# Patient Record
Sex: Male | Born: 1940 | Race: White | Hispanic: No | Marital: Married | Smoking: Never smoker
Health system: Southern US, Community
[De-identification: ages and names within clinical notes are randomized; demographics above are authoritative.]

## PROBLEM LIST (undated history)

## (undated) DIAGNOSIS — M1712 Unilateral primary osteoarthritis, left knee: Secondary | ICD-10-CM

## (undated) DIAGNOSIS — E7800 Pure hypercholesterolemia, unspecified: Secondary | ICD-10-CM

## (undated) DIAGNOSIS — N189 Chronic kidney disease, unspecified: Secondary | ICD-10-CM

## (undated) DIAGNOSIS — I10 Essential (primary) hypertension: Secondary | ICD-10-CM

## (undated) DIAGNOSIS — F32A Depression, unspecified: Secondary | ICD-10-CM

## (undated) DIAGNOSIS — E119 Type 2 diabetes mellitus without complications: Secondary | ICD-10-CM

## (undated) HISTORY — PX: EYE SURGERY: SHX253

## (undated) HISTORY — PX: HERNIA REPAIR: SHX51

## (undated) HISTORY — PX: KNEE ARTHROSCOPY: SUR90

---

## 2016-08-27 HISTORY — PX: ROTATOR CUFF REPAIR: SHX139

## 2021-02-22 ENCOUNTER — Other Ambulatory Visit: Payer: Self-pay | Admitting: Physician Assistant

## 2021-02-22 DIAGNOSIS — M2392 Unspecified internal derangement of left knee: Secondary | ICD-10-CM

## 2021-03-06 ENCOUNTER — Ambulatory Visit: Payer: Medicare Other

## 2021-05-29 ENCOUNTER — Other Ambulatory Visit: Payer: Self-pay | Admitting: Physician Assistant

## 2021-05-29 DIAGNOSIS — M2392 Unspecified internal derangement of left knee: Secondary | ICD-10-CM

## 2021-06-06 ENCOUNTER — Ambulatory Visit
Admission: RE | Admit: 2021-06-06 | Discharge: 2021-06-06 | Disposition: A | Discharge status: Home / Self Care | Payer: Medicare Other | Source: Ambulatory Visit | Attending: Physician Assistant | Admitting: Physician Assistant

## 2021-06-06 ENCOUNTER — Other Ambulatory Visit: Payer: Self-pay

## 2021-06-06 DIAGNOSIS — M2392 Unspecified internal derangement of left knee: Secondary | ICD-10-CM | POA: Insufficient documentation

## 2021-07-26 ENCOUNTER — Other Ambulatory Visit: Payer: Self-pay | Admitting: Surgery

## 2021-08-03 ENCOUNTER — Encounter
Admission: RE | Admit: 2021-08-03 | Discharge: 2021-08-03 | Disposition: A | Discharge status: Home / Self Care | Payer: Medicare Other | Source: Ambulatory Visit | Attending: Surgery | Admitting: Surgery

## 2021-08-03 ENCOUNTER — Other Ambulatory Visit: Payer: Self-pay

## 2021-08-03 DIAGNOSIS — Z01812 Encounter for preprocedural laboratory examination: Secondary | ICD-10-CM

## 2021-08-03 DIAGNOSIS — Z01818 Encounter for other preprocedural examination: Secondary | ICD-10-CM | POA: Insufficient documentation

## 2021-08-03 HISTORY — DX: Depression, unspecified: F32.A

## 2021-08-03 HISTORY — DX: Essential (primary) hypertension: I10

## 2021-08-03 HISTORY — DX: Unilateral primary osteoarthritis, left knee: M17.12

## 2021-08-03 HISTORY — DX: Chronic kidney disease, unspecified: N18.9

## 2021-08-03 HISTORY — DX: Pure hypercholesterolemia, unspecified: E78.00

## 2021-08-03 HISTORY — DX: Type 2 diabetes mellitus without complications: E11.9

## 2021-08-03 LAB — CBC WITH DIFFERENTIAL/PLATELET
Abs Immature Granulocytes: 0.04 10*3/uL (ref 0.00–0.07)
Basophils Absolute: 0 10*3/uL (ref 0.0–0.1)
Basophils Relative: 1 %
Eosinophils Absolute: 0.2 10*3/uL (ref 0.0–0.5)
Eosinophils Relative: 3 %
HCT: 41.6 % (ref 39.0–52.0)
Hemoglobin: 14.3 g/dL (ref 13.0–17.0)
Immature Granulocytes: 1 %
Lymphocytes Relative: 25 %
Lymphs Abs: 1.8 10*3/uL (ref 0.7–4.0)
MCH: 32.4 pg (ref 26.0–34.0)
MCHC: 34.4 g/dL (ref 30.0–36.0)
MCV: 94.1 fL (ref 80.0–100.0)
Monocytes Absolute: 0.6 10*3/uL (ref 0.1–1.0)
Monocytes Relative: 8 %
Neutro Abs: 4.7 10*3/uL (ref 1.7–7.7)
Neutrophils Relative %: 62 %
Platelets: 235 10*3/uL (ref 150–400)
RBC: 4.42 MIL/uL (ref 4.22–5.81)
RDW: 12.3 % (ref 11.5–15.5)
WBC: 7.4 10*3/uL (ref 4.0–10.5)
nRBC: 0 % (ref 0.0–0.2)

## 2021-08-03 LAB — COMPREHENSIVE METABOLIC PANEL
ALT: 16 U/L (ref 0–44)
AST: 18 U/L (ref 15–41)
Albumin: 4.4 g/dL (ref 3.5–5.0)
Alkaline Phosphatase: 64 U/L (ref 38–126)
Anion gap: 8 (ref 5–15)
BUN: 26 mg/dL — ABNORMAL HIGH (ref 8–23)
CO2: 26 mmol/L (ref 22–32)
Calcium: 9.8 mg/dL (ref 8.9–10.3)
Chloride: 105 mmol/L (ref 98–111)
Creatinine, Ser: 1.43 mg/dL — ABNORMAL HIGH (ref 0.61–1.24)
GFR, Estimated: 50 mL/min — ABNORMAL LOW (ref >60)
Glucose, Bld: 124 mg/dL — ABNORMAL HIGH (ref 70–99)
Potassium: 5.1 mmol/L (ref 3.5–5.1)
Sodium: 139 mmol/L (ref 135–145)
Total Bilirubin: 0.8 mg/dL (ref 0.3–1.2)
Total Protein: 7.1 g/dL (ref 6.5–8.1)

## 2021-08-03 LAB — URINALYSIS, ROUTINE W REFLEX MICROSCOPIC
Bilirubin Urine: NEGATIVE
Glucose, UA: NEGATIVE mg/dL
Hgb urine dipstick: NEGATIVE
Ketones, ur: NEGATIVE mg/dL
Leukocytes,Ua: NEGATIVE
Nitrite: NEGATIVE
Protein, ur: NEGATIVE mg/dL
Specific Gravity, Urine: 1.025 (ref 1.005–1.030)
pH: 5.5 (ref 5.0–8.0)

## 2021-08-03 LAB — SURGICAL PCR SCREEN
MRSA, PCR: NEGATIVE
Staphylococcus aureus: NEGATIVE

## 2021-08-03 LAB — TYPE AND SCREEN
ABO/RH(D): O POS
Antibody Screen: NEGATIVE

## 2021-08-03 NOTE — Patient Instructions (Addendum)
Your procedure is scheduled on: 08/15/21 - Tuesday Report to the Registration Desk on the 1st floor of [facility]. To find out your arrival time, please call [phone] between 1PM - 3PM on: 08/14/21 - Monday Report to [facility] for Covid test on 08/11/21 between 8 am - 12 pm.  REMEMBER: Instructions that are not followed completely may result in serious medical risk, up to and including death; or upon the discretion of your surgeon and anesthesiologist your surgery may need to be rescheduled.  Do not eat food after midnight the night before surgery.  No gum chewing, lozengers or hard candies.  You may however, drink CLEAR liquids up to 2 hours before you are scheduled to arrive for your surgery. Do not drink anything within 2 hours of your scheduled arrival time.  Type 1 and Type 2 diabetics should only drink water.  TAKE THESE MEDICATIONS THE MORNING OF SURGERY WITH A SIP OF WATER:  - escitalopram (LEXAPRO) 10 MG tablet   - Do Not take MetFORMIN (GLUCOPHAGE-XR) 500 MG 24 hr tablet on 12/18, 12/19 and do not take the day of surgery.   One week prior to surgery: Stop Anti-inflammatories (NSAIDS) such as Advil, Aleve, Ibuprofen, Motrin, Naproxen, Naprosyn and Aspirin based products such as Excedrin, Goodys Powder, BC Powder.  Stop ANY OVER THE COUNTER supplements until after surgery.  You may however, continue to take Tylenol if needed for pain up until the day of surgery.  No Alcohol for 24 hours before or after surgery.  No Smoking including e-cigarettes for 24 hours prior to surgery.  No chewable tobacco products for at least 6 hours prior to surgery.  No nicotine patches on the day of surgery.  Do not use any "recreational" drugs for at least a week prior to your surgery.  Please be advised that the combination of cocaine and anesthesia may have negative outcomes, up to and including death. If you test positive for cocaine, your surgery will be  cancelled.  On the morning of surgery brush your teeth with toothpaste and water, you may rinse your mouth with mouthwash if you wish. Do not swallow any toothpaste or mouthwash.  Use CHG Soap or wipes as directed on instruction sheet.  Do not wear jewelry, make-up, hairpins, clips or nail polish.  Do not wear lotions, powders, or perfumes.   Do not shave body from the neck down 48 hours prior to surgery just in case you cut yourself which could leave a site for infection.  Also, freshly shaved skin may become irritated if using the CHG soap.  Contact lenses, hearing aids and dentures may not be worn into surgery.  Do not bring valuables to the hospital. [facility] is not responsible for any missing/lost belongings or valuables.   Notify your doctor if there is any change in your medical condition (cold, fever, infection).  Wear comfortable clothing (specific to your surgery type) to the hospital.  After surgery, you can help prevent lung complications by doing breathing exercises.  Take deep breaths and cough every 1-2 hours. Your doctor may order a device called an Incentive Spirometer to help you take deep breaths. When coughing or sneezing, hold a pillow firmly against your incision with both hands. This is called "splinting." Doing this helps protect your incision. It also decreases belly discomfort.  If you are being admitted to the hospital overnight, leave your suitcase in the car. After surgery it may be brought to your room.  If  you are being discharged the day of surgery, you will not be allowed to drive home. You will need a responsible adult (18 years or older) to drive you home and stay with you that night.   If you are taking public transportation, you will need to have a responsible adult (18 years or older) with you. Please confirm with your physician that it is acceptable to use public transportation.   Please call the [place] [facility] at [phone] if you have any questions about these instructions.  Surgery Visitation Policy:  Patients undergoing a surgery or procedure may have one family member or support person with them as long as that person is not COVID-19 positive or experiencing its symptoms.  That person may remain in the waiting area during the procedure and may rotate out with other people.  Inpatient Visitation:    Visiting hours are 7 a.m. to 8 p.m. Up to two visitors ages 16+ are allowed at one time in a patient room. The visitors may rotate out with other people during the day. Visitors must check out when they leave, or other visitors will not be allowed. One designated support person may remain overnight. The visitor must pass COVID-19 screenings, use hand sanitizer when entering and exiting the patient's room and wear a mask at all times, including in the patient's room. Patients must also wear a mask when staff or their visitor are in the room. Masking is required regardless of vaccination status.

## 2021-08-11 ENCOUNTER — Other Ambulatory Visit: Payer: Self-pay

## 2021-08-11 ENCOUNTER — Other Ambulatory Visit
Admission: RE | Admit: 2021-08-11 | Discharge: 2021-08-11 | Disposition: A | Discharge status: Home / Self Care | Payer: Medicare Other | Source: Ambulatory Visit | Attending: Surgery | Admitting: Surgery

## 2021-08-11 DIAGNOSIS — Z20822 Contact with and (suspected) exposure to covid-19: Secondary | ICD-10-CM | POA: Diagnosis not present

## 2021-08-11 DIAGNOSIS — Z01812 Encounter for preprocedural laboratory examination: Secondary | ICD-10-CM | POA: Insufficient documentation

## 2021-08-11 LAB — SARS CORONAVIRUS 2 (TAT 6-24 HRS): SARS Coronavirus 2: NEGATIVE

## 2021-08-15 ENCOUNTER — Encounter
Admission: RE | Disposition: A | Discharge status: Home / Self Care | Payer: Self-pay | Source: Home / Self Care | Attending: Surgery

## 2021-08-15 ENCOUNTER — Ambulatory Visit
Admission: RE | Admit: 2021-08-15 | Discharge: 2021-08-15 | Disposition: A | Discharge status: Home / Self Care | Payer: Medicare Other | Attending: Surgery | Admitting: Surgery

## 2021-08-15 ENCOUNTER — Ambulatory Visit: Payer: Medicare Other | Admitting: Urgent Care

## 2021-08-15 ENCOUNTER — Encounter: Payer: Self-pay | Admitting: Surgery

## 2021-08-15 ENCOUNTER — Other Ambulatory Visit: Payer: Self-pay

## 2021-08-15 ENCOUNTER — Ambulatory Visit: Payer: Medicare Other

## 2021-08-15 ENCOUNTER — Ambulatory Visit: Payer: Medicare Other | Admitting: Anesthesiology

## 2021-08-15 DIAGNOSIS — Z96652 Presence of left artificial knee joint: Secondary | ICD-10-CM

## 2021-08-15 DIAGNOSIS — M1712 Unilateral primary osteoarthritis, left knee: Secondary | ICD-10-CM | POA: Diagnosis present

## 2021-08-15 DIAGNOSIS — I129 Hypertensive chronic kidney disease with stage 1 through stage 4 chronic kidney disease, or unspecified chronic kidney disease: Secondary | ICD-10-CM | POA: Diagnosis not present

## 2021-08-15 DIAGNOSIS — S83232A Complex tear of medial meniscus, current injury, left knee, initial encounter: Secondary | ICD-10-CM | POA: Insufficient documentation

## 2021-08-15 DIAGNOSIS — E1122 Type 2 diabetes mellitus with diabetic chronic kidney disease: Secondary | ICD-10-CM | POA: Diagnosis not present

## 2021-08-15 DIAGNOSIS — N183 Chronic kidney disease, stage 3 unspecified: Secondary | ICD-10-CM | POA: Insufficient documentation

## 2021-08-15 DIAGNOSIS — X58XXXA Exposure to other specified factors, initial encounter: Secondary | ICD-10-CM | POA: Diagnosis not present

## 2021-08-15 HISTORY — PX: PARTIAL KNEE ARTHROPLASTY: SHX2174

## 2021-08-15 LAB — GLUCOSE, CAPILLARY
Glucose-Capillary: 225 mg/dL — ABNORMAL HIGH (ref 70–99)
Glucose-Capillary: 145 mg/dL — ABNORMAL HIGH (ref 70–99)

## 2021-08-15 LAB — ABO/RH: ABO/RH(D): O POS

## 2021-08-15 SURGERY — ARTHROPLASTY, KNEE, UNICOMPARTMENTAL
Anesthesia: Spinal | Site: Knee | Laterality: Left

## 2021-08-15 MED ORDER — CEFAZOLIN SODIUM-DEXTROSE 2-4 GM/100ML-% IV SOLN
INTRAVENOUS | Status: AC
  Filled 2021-08-15: qty 100

## 2021-08-15 MED ORDER — SODIUM CHLORIDE (PF) 0.9 % IJ SOLN
INTRAMUSCULAR | Status: DC | PRN
  Administered 2021-08-15: 60 mL via INTRAMUSCULAR

## 2021-08-15 MED ORDER — BUPIVACAINE HCL (PF) 0.5 % IJ SOLN
INTRAMUSCULAR | Status: DC | PRN
  Administered 2021-08-15: 2.5 mL via INTRATHECAL

## 2021-08-15 MED ORDER — PHENYLEPHRINE HCL-NACL 20-0.9 MG/250ML-% IV SOLN
INTRAVENOUS | Status: AC
  Filled 2021-08-15: qty 250

## 2021-08-15 MED ORDER — ONDANSETRON HCL 4 MG/2ML IJ SOLN: 4.0000 mg | Freq: Once | INTRAMUSCULAR | Status: DC | PRN

## 2021-08-15 MED ORDER — SODIUM CHLORIDE 0.9 % IV SOLN: INTRAVENOUS | Status: DC

## 2021-08-15 MED ORDER — INSULIN ASPART 100 UNIT/ML IJ SOLN
INTRAMUSCULAR | Status: AC
  Filled 2021-08-15: qty 1

## 2021-08-15 MED ORDER — KETOROLAC TROMETHAMINE 15 MG/ML IJ SOLN: 7.5000 mg | Freq: Four times a day (QID) | INTRAMUSCULAR | Status: DC

## 2021-08-15 MED ORDER — MIDAZOLAM HCL 2 MG/2ML IJ SOLN
INTRAMUSCULAR | Status: AC
  Filled 2021-08-15: qty 2

## 2021-08-15 MED ORDER — SODIUM CHLORIDE FLUSH 0.9 % IV SOLN
INTRAVENOUS | Status: AC
  Filled 2021-08-15: qty 40

## 2021-08-15 MED ORDER — TRANEXAMIC ACID 1000 MG/10ML IV SOLN
INTRAVENOUS | Status: AC
  Filled 2021-08-15: qty 10

## 2021-08-15 MED ORDER — PHENYLEPHRINE HCL-NACL 20-0.9 MG/250ML-% IV SOLN
INTRAVENOUS | Status: DC | PRN
  Administered 2021-08-15: 30 ug/min via INTRAVENOUS

## 2021-08-15 MED ORDER — ONDANSETRON HCL 4 MG PO TABS: 4.0000 mg | ORAL_TABLET | Freq: Four times a day (QID) | ORAL | Status: DC | PRN

## 2021-08-15 MED ORDER — BUPIVACAINE LIPOSOME 1.3 % IJ SUSP
INTRAMUSCULAR | Status: AC
  Filled 2021-08-15: qty 20

## 2021-08-15 MED ORDER — METOCLOPRAMIDE HCL 5 MG/ML IJ SOLN: 5.0000 mg | Freq: Three times a day (TID) | INTRAMUSCULAR | Status: DC | PRN

## 2021-08-15 MED ORDER — BUPIVACAINE-EPINEPHRINE (PF) 0.5% -1:200000 IJ SOLN
INTRAMUSCULAR | Status: AC
  Filled 2021-08-15: qty 30

## 2021-08-15 MED ORDER — ONDANSETRON HCL 4 MG/2ML IJ SOLN: 4.0000 mg | Freq: Four times a day (QID) | INTRAMUSCULAR | Status: DC | PRN

## 2021-08-15 MED ORDER — ORAL CARE MOUTH RINSE: 15.0000 mL | Freq: Once | OROMUCOSAL | Status: AC

## 2021-08-15 MED ORDER — TRANEXAMIC ACID 1000 MG/10ML IV SOLN
INTRAVENOUS | Status: DC | PRN
  Administered 2021-08-15: 14:00:00 1000 mg via TOPICAL

## 2021-08-15 MED ORDER — OXYCODONE HCL 5 MG PO TABS: 5.0000 mg | ORAL_TABLET | ORAL | Status: DC | PRN

## 2021-08-15 MED ORDER — FENTANYL CITRATE (PF) 100 MCG/2ML IJ SOLN: 25.0000 ug | INTRAMUSCULAR | Status: DC | PRN

## 2021-08-15 MED ORDER — MEPERIDINE HCL 25 MG/ML IJ SOLN: 6.2500 mg | INTRAMUSCULAR | Status: DC | PRN

## 2021-08-15 MED ORDER — MIDAZOLAM HCL 5 MG/5ML IJ SOLN
INTRAMUSCULAR | Status: DC | PRN
  Administered 2021-08-15: 2 mg via INTRAVENOUS

## 2021-08-15 MED ORDER — PROPOFOL 500 MG/50ML IV EMUL
INTRAVENOUS | Status: DC | PRN
  Administered 2021-08-15: 100 ug/kg/min via INTRAVENOUS
  Administered 2021-08-15: 70 ug/kg/min via INTRAVENOUS

## 2021-08-15 MED ORDER — CEFAZOLIN SODIUM-DEXTROSE 2-4 GM/100ML-% IV SOLN
2.0000 g | Freq: Four times a day (QID) | INTRAVENOUS | Status: DC
  Administered 2021-08-15: 17:00:00 2 g via INTRAVENOUS

## 2021-08-15 MED ORDER — INSULIN ASPART 100 UNIT/ML IJ SOLN
3.0000 [IU] | Freq: Once | INTRAMUSCULAR | Status: AC
  Administered 2021-08-15: 12:00:00 3 [IU] via SUBCUTANEOUS

## 2021-08-15 MED ORDER — KETOROLAC TROMETHAMINE 15 MG/ML IJ SOLN
INTRAMUSCULAR | Status: AC
  Filled 2021-08-15: qty 1

## 2021-08-15 MED ORDER — CEFAZOLIN SODIUM-DEXTROSE 2-4 GM/100ML-% IV SOLN
2.0000 g | INTRAVENOUS | Status: AC
  Administered 2021-08-15: 12:00:00 2 g via INTRAVENOUS

## 2021-08-15 MED ORDER — ACETAMINOPHEN 500 MG PO TABS: 1000.0000 mg | ORAL_TABLET | Freq: Four times a day (QID) | ORAL | Status: DC

## 2021-08-15 MED ORDER — PROPOFOL 1000 MG/100ML IV EMUL
INTRAVENOUS | Status: AC
  Filled 2021-08-15: qty 100

## 2021-08-15 MED ORDER — PROPOFOL 10 MG/ML IV BOLUS
INTRAVENOUS | Status: DC | PRN
  Administered 2021-08-15: 50 mg via INTRAVENOUS

## 2021-08-15 MED ORDER — SODIUM CHLORIDE 0.9 % BOLUS PEDS
250.0000 mL | Freq: Once | INTRAVENOUS | Status: AC
  Administered 2021-08-15: 15:00:00 250 mL via INTRAVENOUS

## 2021-08-15 MED ORDER — APIXABAN 2.5 MG PO TABS: 2.5000 mg | ORAL_TABLET | Freq: Two times a day (BID) | ORAL | 0 refills | Status: AC

## 2021-08-15 MED ORDER — LACTATED RINGERS IV SOLN: INTRAVENOUS | Status: DC

## 2021-08-15 MED ORDER — CHLORHEXIDINE GLUCONATE 0.12 % MT SOLN: 15.0000 mL | Freq: Once | OROMUCOSAL | Status: AC

## 2021-08-15 MED ORDER — 0.9 % SODIUM CHLORIDE (POUR BTL) OPTIME
TOPICAL | Status: DC | PRN
  Administered 2021-08-15: 13:00:00 500 mL

## 2021-08-15 MED ORDER — CHLORHEXIDINE GLUCONATE 0.12 % MT SOLN
OROMUCOSAL | Status: AC
  Administered 2021-08-15: 11:00:00 15 mL via OROMUCOSAL
  Filled 2021-08-15: qty 15

## 2021-08-15 MED ORDER — SODIUM CHLORIDE 0.9 % IR SOLN
Status: DC | PRN
  Administered 2021-08-15: 3000 mL

## 2021-08-15 MED ORDER — METOCLOPRAMIDE HCL 10 MG PO TABS: 5.0000 mg | ORAL_TABLET | Freq: Three times a day (TID) | ORAL | Status: DC | PRN

## 2021-08-15 MED ORDER — KETOROLAC TROMETHAMINE 15 MG/ML IJ SOLN
15.0000 mg | Freq: Once | INTRAMUSCULAR | Status: AC
  Administered 2021-08-15: 15:00:00 15 mg via INTRAVENOUS

## 2021-08-15 MED ORDER — ONDANSETRON HCL 4 MG/2ML IJ SOLN
INTRAMUSCULAR | Status: DC | PRN
  Administered 2021-08-15: 4 mg via INTRAVENOUS

## 2021-08-15 MED ORDER — SODIUM CHLORIDE 0.9 % BOLUS PEDS
250.0000 mL | Freq: Once | INTRAVENOUS | Status: AC
  Administered 2021-08-15: 16:00:00 250 mL via INTRAVENOUS

## 2021-08-15 MED ORDER — OXYCODONE HCL 5 MG PO TABS: 5.0000 mg | ORAL_TABLET | ORAL | 0 refills | Status: AC | PRN

## 2021-08-15 SURGICAL SUPPLY — 68 items
APL PRP STRL LF DISP 70% ISPRP (MISCELLANEOUS) ×2
BEARING MENISCAL TIBIAL 5 MD L (Orthopedic Implant) ×2 IMPLANT
BIT DRILL QUICK REL 1/8 2PK SL (DRILL) IMPLANT
BNDG ELASTIC 6X5.8 VLCR STR LF (GAUZE/BANDAGES/DRESSINGS) ×3 IMPLANT
BRNG TIB B UNCMP STRL LM/RL (Joint) ×1 IMPLANT
BRNG TIB MED 5 PHS 3 LT MEN (Orthopedic Implant) ×1 IMPLANT
CEMENT BONE R 1X40 (Cement) ×3 IMPLANT
CEMENT VACUUM MIXING SYSTEM (MISCELLANEOUS) ×3 IMPLANT
CHLORAPREP W/TINT 26 (MISCELLANEOUS) ×6 IMPLANT
COOLER POLAR GLACIER W/PUMP (MISCELLANEOUS) ×3 IMPLANT
COVER MAYO STAND REUSABLE (DRAPES) ×3 IMPLANT
CUFF TOURN SGL QUICK 24 (TOURNIQUET CUFF)
CUFF TOURN SGL QUICK 34 (TOURNIQUET CUFF)
CUFF TRNQT CYL 24X4X16.5-23 (TOURNIQUET CUFF) IMPLANT
CUFF TRNQT CYL 34X4.125X (TOURNIQUET CUFF) IMPLANT
DRAPE C-ARM XRAY 36X54 (DRAPES) IMPLANT
DRAPE U-SHAPE 47X51 STRL (DRAPES) ×6 IMPLANT
DRILL QUICK RELEASE 1/8 INCH (DRILL) ×4
DRSG MEPILEX SACRM 8.7X9.8 (GAUZE/BANDAGES/DRESSINGS) ×3 IMPLANT
DRSG OPSITE POSTOP 4X12 (GAUZE/BANDAGES/DRESSINGS) ×3 IMPLANT
DRSG OPSITE POSTOP 4X6 (GAUZE/BANDAGES/DRESSINGS) ×3 IMPLANT
ELECT CAUTERY BLADE 6.4 (BLADE) ×3 IMPLANT
ELECT REM PT RETURN 9FT ADLT (ELECTROSURGICAL) ×3
ELECTRODE REM PT RTRN 9FT ADLT (ELECTROSURGICAL) ×1 IMPLANT
GAUZE SPONGE 4X4 12PLY STRL (GAUZE/BANDAGES/DRESSINGS) ×3 IMPLANT
GAUZE XEROFORM 1X8 LF (GAUZE/BANDAGES/DRESSINGS) ×3 IMPLANT
GLOVE SRG 8 PF TXTR STRL LF DI (GLOVE) ×1 IMPLANT
GLOVE SURG ENC MOIS LTX SZ7.5 (GLOVE) ×12 IMPLANT
GLOVE SURG ENC MOIS LTX SZ8 (GLOVE) ×12 IMPLANT
GLOVE SURG UNDER LTX SZ8 (GLOVE) ×3 IMPLANT
GLOVE SURG UNDER POLY LF SZ8 (GLOVE) ×3
GOWN STRL REUS W/ TWL LRG LVL3 (GOWN DISPOSABLE) ×1 IMPLANT
GOWN STRL REUS W/ TWL XL LVL3 (GOWN DISPOSABLE) ×1 IMPLANT
GOWN STRL REUS W/TWL LRG LVL3 (GOWN DISPOSABLE) ×3
GOWN STRL REUS W/TWL XL LVL3 (GOWN DISPOSABLE) ×3
INSERT TIBIAL OXFORD SZ B LF (Joint) ×2 IMPLANT
IV NS IRRIG 3000ML ARTHROMATIC (IV SOLUTION) ×3 IMPLANT
KIT TURNOVER KIT A (KITS) ×3 IMPLANT
MANIFOLD NEPTUNE II (INSTRUMENTS) ×3 IMPLANT
MAT ABSORB  FLUID 56X50 GRAY (MISCELLANEOUS) ×2
MAT ABSORB FLUID 56X50 GRAY (MISCELLANEOUS) ×1 IMPLANT
NDL SAFETY ECLIPSE 18X1.5 (NEEDLE) ×1 IMPLANT
NDL SPNL 20GX3.5 QUINCKE YW (NEEDLE) ×1 IMPLANT
NEEDLE HYPO 18GX1.5 SHARP (NEEDLE) ×3
NEEDLE SPNL 20GX3.5 QUINCKE YW (NEEDLE) ×3 IMPLANT
NS IRRIG 1000ML POUR BTL (IV SOLUTION) ×1 IMPLANT
NS IRRIG 500ML POUR BTL (IV SOLUTION) ×2 IMPLANT
PACK BLADE SAW RECIP 70 3 PT (BLADE) ×2 IMPLANT
PACK TOTAL KNEE (MISCELLANEOUS) ×3 IMPLANT
PAD ABD DERMACEA PRESS 5X9 (GAUZE/BANDAGES/DRESSINGS) ×6 IMPLANT
PAD WRAPON POLAR KNEE (MISCELLANEOUS) ×1 IMPLANT
PEG TWIN FEM CEMENTED MED (Knees) ×2 IMPLANT
PULSAVAC PLUS IRRIG FAN TIP (DISPOSABLE) ×3
SPONGE T-LAP 18X18 ~~LOC~~+RFID (SPONGE) ×9 IMPLANT
STAPLER SKIN PROX 35W (STAPLE) ×3 IMPLANT
STRAP SAFETY 5IN WIDE (MISCELLANEOUS) ×3 IMPLANT
SUCTION FRAZIER HANDLE 10FR (MISCELLANEOUS) ×2
SUCTION TUBE FRAZIER 10FR DISP (MISCELLANEOUS) ×1 IMPLANT
SUT VIC AB 0 CT1 36 (SUTURE) ×3 IMPLANT
SUT VIC AB 2-0 CT1 27 (SUTURE) ×6
SUT VIC AB 2-0 CT1 TAPERPNT 27 (SUTURE) ×4 IMPLANT
SYR 10ML LL (SYRINGE) ×3 IMPLANT
SYR 30ML LL (SYRINGE) ×6 IMPLANT
TAPE TRANSPORE STRL 2 31045 (GAUZE/BANDAGES/DRESSINGS) ×3 IMPLANT
TIP FAN IRRIG PULSAVAC PLUS (DISPOSABLE) ×1 IMPLANT
WATER STERILE IRR 1000ML POUR (IV SOLUTION) ×2 IMPLANT
WATER STERILE IRR 500ML POUR (IV SOLUTION) ×1 IMPLANT
WRAPON POLAR PAD KNEE (MISCELLANEOUS) ×3

## 2021-08-15 NOTE — Anesthesia Procedure Notes (Signed)
Spinal ° °Patient location during procedure: OR °Start time: 08/15/2021 12:11 PM °End time: 08/15/2021 12:13 PM °Reason for block: surgical anesthesia °Staffing °Performed: resident/CRNA  °Anesthesiologist: Jaszewski, Paul, MD °Resident/CRNA: Bachich, Jennifer, CRNA °Preanesthetic Checklist °Completed: patient identified, IV checked, site marked, risks and benefits discussed, surgical consent, monitors and equipment checked, pre-op evaluation and timeout performed °Spinal Block °Patient position: sitting °Prep: ChloraPrep °Patient monitoring: heart rate, continuous pulse ox, blood pressure and cardiac monitor °Approach: midline °Location: L3-4 °Injection technique: single-shot °Needle °Needle type: Introducer and Pencan  °Needle gauge: 24 G °Needle length: 9 cm °Assessment °Sensory level: T10 °Events: CSF return °Additional Notes °Negative paresthesia. Negative blood return. Positive free-flowing CSF. Expiration date of kit checked and confirmed. Patient tolerated procedure well, without complications. ° ° ° ° ° °

## 2021-08-15 NOTE — Anesthesia Preprocedure Evaluation (Signed)
Anesthesia Evaluation  Patient identified by MRN, date of birth, ID band Patient awake    Reviewed: Allergy & Precautions, NPO status , Patient's Chart, lab work & pertinent test results  Airway Mallampati: III  TM Distance: >3 FB Neck ROM: Full    Dental  (+) Chipped, Poor Dentition,    Pulmonary neg pulmonary ROS,    Pulmonary exam normal        Cardiovascular hypertension, Pt. on medications negative cardio ROS Normal cardiovascular exam     Neuro/Psych PSYCHIATRIC DISORDERS Depression negative neurological ROS     GI/Hepatic negative GI ROS, Neg liver ROS,   Endo/Other  negative endocrine ROSdiabetes, Well Controlled, Oral Hypoglycemic Agents  Renal/GU Renal InsufficiencyRenal disease  negative genitourinary   Musculoskeletal  (+) Arthritis , Osteoarthritis,    Abdominal   Peds negative pediatric ROS (+)  Hematology negative hematology ROS (+)   Anesthesia Other Findings Chronic kidney disease    Depression    Diabetes mellitus without complication (HCC)    Hypercholesterolemia    Hypertension    Primary osteoarthritis of left knee       Reproductive/Obstetrics negative OB ROS                            Anesthesia Physical Anesthesia Plan  ASA: 3  Anesthesia Plan: Spinal   Post-op Pain Management:    Induction: Intravenous  PONV Risk Score and Plan: 2 and Midazolam and Propofol infusion  Airway Management Planned: Mask and Natural Airway  Additional Equipment:   Intra-op Plan:   Post-operative Plan:   Informed Consent: I have reviewed the patients History and Physical, chart, labs and discussed the procedure including the risks, benefits and alternatives for the proposed anesthesia with the patient or authorized representative who has indicated his/her understanding and acceptance.       Plan Discussed with: CRNA, Surgeon and Anesthesiologist  Anesthesia Plan  Comments:         Anesthesia Quick Evaluation

## 2021-08-15 NOTE — Evaluation (Signed)
Physical Therapy Evaluation Patient Details Name: Trevor Carson MRN: [id number] DOB: 1940/09/16 Today's Date: 08/15/2021  History of Present Illness  s/p L unicompartmental knee replacement, WBAT (08/15/21)  Clinical Impression  Patient resting in bed upon arrival to session; alert and oriented, follows commands and agreeable to participation with session.  Pain well-controlled (rated 0-1/10).  Sensation fully returned with excellent post-op strength (3-/5) and ROM (5-115 degrees) to L knee noted.  Easily completes SLR and demonstrates excellent control in closed-chain position. Able to complete bed mobility with mod indep; sit/stand, basic transfers and gait (100') with RW, cga/close sup; stairs (x4) with bilat rails, cga/close sup.  Gait pattern demonstrates reciprocal stepping pattern with good L LE WBing, stance time; steady cadence with good control. Minimal/no reports of pain with WBing. Reviewed role of PT and progressive mobility, WBing restrictions and safety with transfers/gait (including use of RW); issued handout with written/pictorial descriptions of supine therex; reviewed car transfer technique, positioning, polar care use, dressing-techniques for LBD. Patient/wife voiced understanding of all information; no further questions/concerns at this time. Would benefit from skilled PT to address above deficits and promote optimal return to PLOF.; Recommend transition to HHPT upon discharge from acute hospitalization.      Recommendations for follow up therapy are one component of a multi-disciplinary discharge planning process, led by the attending physician.  Recommendations may be updated based on patient status, additional functional criteria and insurance authorization.  Follow Up Recommendations Home health PT    Assistance Recommended at Discharge PRN  Functional Status Assessment Patient has had a recent decline in their functional status and demonstrates the ability to make  significant improvements in function in a reasonable and predictable amount of time.  Equipment Recommendations   (has RW)    Recommendations for Other Services       Precautions / Restrictions Precautions Precautions: Fall Restrictions Weight Bearing Restrictions: Yes LLE Weight Bearing: Weight bearing as tolerated      Mobility  Bed Mobility Overal bed mobility: Modified Independent                  Transfers Overall transfer level: Needs assistance Equipment used: Rolling walker (2 wheels) Transfers: Sit to/from Stand Sit to Stand: Supervision;Min guard           General transfer comment: good active use, WBing L LE; min cuing for hand placement to prevent pulling on RW    Ambulation/Gait Ambulation/Gait assistance: Min guard Gait Distance (Feet): 100 Feet Assistive device: Rolling walker (2 wheels)         General Gait Details: reciprocal stepping pattern with good L LE WBing, stance time; steady cadence with good control. Minimal/no reports of pain with WBing.  Stairs Stairs: Yes Stairs assistance: Supervision Stair Management: Two rails Number of Stairs: 4 General stair comments: step-to gait pattern; good L knee control and stability in modified SLS.  Did also review technique for single stair/threshold management with RW; therapist demonstrated/verbally reviewed technique. Patient/wife voiced understanding.  Wheelchair Mobility    Modified Rankin (Stroke Patients Only)       Balance Overall balance assessment: Needs assistance Sitting-balance support: No upper extremity supported;Feet supported Sitting balance-Leahy Scale: Good     Standing balance support: Bilateral upper extremity supported Standing balance-Leahy Scale: Good                               Pertinent Vitals/Pain Pain Assessment: No/denies pain  Home Living Family/patient expects to be discharged to:: Private residence Living Arrangements:  Spouse/significant other Available Help at Discharge: Family Type of Home: House Home Access: Stairs to enter Entrance Stairs-Rails: None Entrance Stairs-Number of Steps: 1   Home Layout: One level Home Equipment: Agricultural consultant (2 wheels)      Prior Function Prior Level of Function : Independent/Modified Independent             Mobility Comments: Indep with ADLs, household and community mobilization without assist device; intermittent use of SPC for community distances.  Denies fall history.  Active, enjoys golfing.       Hand Dominance        Extremity/Trunk Assessment   Upper Extremity Assessment Upper Extremity Assessment: Overall WFL for tasks assessed    Lower Extremity Assessment Lower Extremity Assessment: Generalized weakness (L knee grossly 3-/5, limited by post-op dressing.  Otherwise, bilat LEs grossly WFL)       Communication   Communication: No difficulties  Cognition Arousal/Alertness: Awake/alert Behavior During Therapy: WFL for tasks assessed/performed Overall Cognitive Status: Within Functional Limits for tasks assessed                                          General Comments      Exercises Total Joint Exercises Goniometric ROM: L knee: 5-115 degrees Other Exercises Other Exercises: Reviewed role of PT and progressive mobility, WBing restrictions and safety with transfers/gait (including use of RW); issued handout with written/pictorial descriptions of supine therex; reviewed car transfer technique, positioning, polar care use, dressing-techniques for LBD. Patient/wife voiced understanding of all information; no further questions/concerns at this time.   Assessment/Plan    PT Assessment Patient needs continued PT services  PT Problem List Decreased strength;Decreased range of motion;Decreased activity tolerance;Decreased balance;Decreased mobility;Decreased knowledge of use of DME;Decreased safety awareness;Decreased  knowledge of precautions;Pain       PT Treatment Interventions DME instruction;Gait training;Stair training;Functional mobility training;Therapeutic activities;Therapeutic exercise;Balance training;Patient/family education    PT Goals (Current goals can be found in the Care Plan section)  Acute Rehab PT Goals Patient Stated Goal: to return home PT Goal Formulation: With patient/family Time For Goal Achievement: 08/29/21 Potential to Achieve Goals: Good    Frequency BID   Barriers to discharge        Co-evaluation               AM-PAC PT "6 Clicks" Mobility  Outcome Measure Help needed turning from your back to your side while in a flat bed without using bedrails?: None Help needed moving from lying on your back to sitting on the side of a flat bed without using bedrails?: None Help needed moving to and from a bed to a chair (including a wheelchair)?: None Help needed standing up from a chair using your arms (e.g., wheelchair or bedside chair)?: A Little Help needed to walk in hospital room?: A Little Help needed climbing 3-5 steps with a railing? : A Little 6 Click Score: 21    End of Session Equipment Utilized During Treatment: Gait belt Activity Tolerance: Patient tolerated treatment well Patient left: in bed;with call bell/phone within reach;with family/visitor present Nurse Communication: Mobility status PT Visit Diagnosis: Muscle weakness (generalized) (M62.81);Other abnormalities of gait and mobility (R26.89);Pain Pain - Right/Left: Left Pain - part of body: Knee    Time: 6270-3500 PT Time Calculation (min) (ACUTE ONLY): 26 min  Charges:   PT Evaluation $PT Eval Moderate Complexity: 1 Mod PT Treatments $Therapeutic Activity: 8-22 mins        Junior Huezo H. Manson Passey, PT, DPT, NCS 08/15/21, 4:35 PM [phone]

## 2021-08-15 NOTE — OR Nursing (Signed)
Dr. Joice Lofts notified of inablility to urinate.  Bladder scan 525cc.  Order for in and out cath.

## 2021-08-15 NOTE — H&P (Signed)
History of Present Illness: Trevor Carson is a 80 y.o. who presents today for a history and physical. He is to undergo a left partial knee arthroplasty on 08/15/2021. Last seen in the clinic on 06/19/2021. No change in his condition since that time.  Patient has been experiencing discomfort to the left knee for several years which developed without any specific cause or any injury. He has been seen over the last 6 months for which she has received steroid injections with moderate relief for several months. Also underwent Orthovisc injections which provided minimal improvement.Therefore, the patient was sent for an MRI scan and referred to me for further evaluation and treatment. He reports 4/10 pain. The pain is located along the medial aspect of the knee. The pain is described as aching, dull, stabbing and throbbing. The symptoms are aggravated with normal daily activities, with sleeping, using stairs, at higher levels of activity, walking, standing and standing pivot. He also describes an occasional catching sensation in his knee. He also has difficulty reciprocating stairs. He has mild associated swelling and deformity. He has tried acetaminophen, over-the-counter medications, anti-inflammatories, steroid injections, viscosupplementation injections, a home exercise program and ice with limited benefit.  Of note patient recently blood pressure was changed. His blood pressure has significantly improved but subsequently laboratory studies taken for preop showed increased elevation of his BUN with creatinine staying the same. Potassium went from 4.3-5.1.  Patient states that the pain is to the point he wishes to proceed with surgery secondary to interfering with the quality of his life.  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   Essential hypertension 11/30/2020 (Better at home and fluctuates a lot, on ACE inhibitor)    Hypercholesterolemia 11/30/2020   Sleep apnea   Past Surgical History:  CATARACT EXTRACTION   HERNIA REPAIR   KNEE ARTHROSCOPY   VASECTOMY   Past Family History:  Coronary Artery Disease (Blocked arteries around heart) Mother   Stroke Father   No Known Problems Sister   Medications:  atorvastatin (LIPITOR) 20 MG tablet Take 1 tablet (20 mg total) by mouth once daily 90 tablet 3   blood glucose diagnostic (CONTOUR NEXT TEST STRIPS) test strip 1 each (1 strip total) 3 (three) times daily Use as instructed.   CONTOUR NEXT ONE METER Misc 1 each as directed   escitalopram oxalate (LEXAPRO) 10 MG tablet Take 1 tablet (10 mg total) by mouth once daily 90 tablet 3   glipiZIDE (GLUCOTROL) 2.5 MG XL tablet Take 1 tablet (2.5 mg total) by mouth 2 (two) times daily   L. acidophilus/Bifido. longum (PROBIOTIC PEARLS ACIDOPHILUS ORAL) Take 1 capsule by mouth once daily   lancets Use 1 each 3 (three) times daily And test strips for BID use for DM II 100 each 12   lisinopriL-hydrochlorothiazide (ZESTORETIC) 20-12.5 mg tablet Take 1 tablet by mouth once daily 90 tablet 11   metFORMIN (GLUCOPHAGE-XR) 500 MG XR tablet Take 2 tablets (1,000 mg total) by mouth 2 (two) times daily   naproxen sodium (ALEVE) 220 MG tablet Take 1 tablet (220 mg total) by mouth 2 (two) times daily with meals   zaleplon (SONATA) 10 MG capsule Take 1 capsule (10 mg total) by mouth nightly as needed 30 capsule 5   Allergies: No Known Allergies   Review of Systems: A comprehensive 14 point ROS was performed, reviewed, and the pertinent orthopaedic findings are documented in the HPI.  Physical  Exam: BP 136/82 (BP Location: Left upper arm, Patient Position: Sitting, BP Cuff Size: Adult)   Ht 185.4 cm (6\' 1" )   Wt 88 kg (194 lb)   BMI 25.60 kg/m   General: Well-developed well-nourished male seen in no acute distress.   HEENT: Atraumatic,normocephalic. Pupils are equal and reactive to light. Oropharynx is clear with moist  mucosa  Lungs: Clear to auscultation bilaterally   Cardiovascular: Regular rate and rhythm. Normal S1, S2. No murmurs. No appreciable gallops or rubs. Peripheral pulses are palpable.  Abdomen: Soft, non-tender, nondistended. Bowel sounds present  Left knee exam: GAIT: mild limp and uses no assistive devices. ALIGNMENT: mild varus SKIN: unremarkable SWELLING: minimal EFFUSION: trace WARMTH: no warmth TENDERNESS: mild-moderate over the medial joint line ROM: 0 to 135 degrees with mild discomfort in maximal flexion McMURRAY'S: equivocal PATELLOFEMORAL: normal tracking with no peri-patellar tenderness and negative apprehension sign CREPITUS: no LACHMAN'S: negative PIVOT SHIFT: negative ANTERIOR DRAWER: negative POSTERIOR DRAWER: negative VARUS/VALGUS: Mildly positive pseudolaxity to varus stressing  Neurological: The patient is alert and oriented Sensation to light touch appears to be intact and within normal limits Gross motor strength appeared to be equal to 5/5  Vascular : Peripheral pulses felt to be palpable. Capillary refill appears to be intact and within normal limits  X-ray: 1. X-rays taken in [place] clinic showed mild degenerative changes primarily involving the medial compartment of 10%. Overall alignment was neutral. No acute bony abnormalities are seen.  2.Left knee: A recent MRI scan of the left knee is available for review. By report, the study demonstrates evidence of a complex degenerative tear of the posterior portion of the medial meniscus with a possible displaced meniscal fragment. It also demonstrates "severe chondral thinning and surface irregularity over the medial femoral condyle" as well as "subchondral edema peripherally in the medial tibial plateau." The lateral compartment appears to be well-maintained as does the lateral meniscus. There is no evidence for cruciate or collateral ligament pathology.   Impression: 1. Degenerative arthrosis left  knee 2. Complex tear medial meniscus left knee  Plan:  The treatment options, including both surgical and nonsurgical choices, have been discussed in detail with the patient.  He would like to proceed with a left partial knee replacement. The risks (including bleeding, infection, nerve and/or blood vessel injury, persistent or recurrent pain, loosening or failure of the components, dislocation, need for further surgery, blood clots, strokes, heart attacks or arrhythmias, pneumonia, etc.) and benefits of the surgical procedure were discussed. The patient states his understanding and agrees to proceed. A formal written consent will be obtained by the nursing staff.   H&P reviewed and patient re-examined. No changes.

## 2021-08-15 NOTE — Discharge Instructions (Addendum)
Orthopedic discharge instructions: May shower with intact OpSite dressing. Apply ice frequently to knee or use Polar Care device. Start Eliquis 2.5 mg twice daily for 2 weeks on Wednesday, 08/16/2021, then take aspirin 325 mg daily for 4 weeks. Take pain medication as prescribed or ES Tylenol when needed.  May weight-bear as tolerated on left leg - use walker as needed for balance and support. Start home PT Thursday as scheduled. Follow-up in 10-14 days or as scheduled.  AMBULATORY SURGERY  DISCHARGE INSTRUCTIONS   The drugs that you were given will stay in your system until tomorrow so for the next 24 hours you should not:  Drive an automobile Make any legal decisions Drink any alcoholic beverage   You may resume regular meals tomorrow.  Today it is better to start with liquids and gradually work up to solid foods.  You may eat anything you prefer, but it is better to start with liquids, then soup and crackers, and gradually work up to solid foods.   Please notify your doctor immediately if you have any unusual bleeding, trouble breathing, redness and pain at the surgery site, drainage, fever, or pain not relieved by medication.    Additional Instructions:  Leave green band on for 4 days Start with tylenol 1000mg  then ibuprofen 800mg  alternating every 4 hours.  Use oxycodone if needed.  Stoll softener to prevent infection   Please contact your physician with any problems or Same Day Surgery at [phone], Monday through Friday 6 am to 4 pm, or [place] at [facility] number at [phone].

## 2021-08-15 NOTE — Anesthesia Postprocedure Evaluation (Signed)
Anesthesia Post Note  Patient: Trevor Carson  Procedure(s) Performed: UNICOMPARTMENTAL KNEE (Left: Knee)  Patient location during evaluation: PACU Anesthesia Type: Spinal Level of consciousness: awake and alert, awake and oriented Pain management: pain level controlled Vital Signs Assessment: post-procedure vital signs reviewed and stable Respiratory status: spontaneous breathing, nonlabored ventilation and respiratory function stable Cardiovascular status: blood pressure returned to baseline and stable Postop Assessment: no apparent nausea or vomiting Anesthetic complications: no   No notable events documented.   Last Vitals:  Vitals:   08/15/21 1430 08/15/21 1500  BP: (!) 116/57 (!) 148/64  Pulse: 67 64  Resp: 17 12  Temp:    SpO2: 99% 99%    Last Pain:  Vitals:   08/15/21 1430  TempSrc:   PainSc: 0-No pain                 Manfred Arch

## 2021-08-15 NOTE — Transfer of Care (Signed)
Immediate Anesthesia Transfer of Care Note  Patient: Trevor Carson  Procedure(s) Performed: UNICOMPARTMENTAL KNEE (Left: Knee)  Patient Location: PACU  Anesthesia Type:Spinal  Level of Consciousness: awake, alert  and oriented  Airway & Oxygen Therapy: Patient Spontanous Breathing and Patient connected to face mask oxygen  Post-op Assessment: Report given to RN and Post -op Vital signs reviewed and stable  Post vital signs: Reviewed and stable  Last Vitals:  Vitals Value Taken Time  BP 109/63 08/15/21 1406  Temp    Pulse 85 08/15/21 1407  Resp 19 08/15/21 1407  SpO2 98 % 08/15/21 1407  Vitals shown include unvalidated device data.  Last Pain:  Vitals:   08/15/21 1003  TempSrc: Temporal  PainSc: 5          Complications: No notable events documented.

## 2021-08-15 NOTE — OR Nursing (Signed)
Attempted in and out cath with straight cath unable to enter bladder.  Followed with a coudee cath and sttached to foley while waiting for MD to evaluate.  525cc of clear yellow urine obtained.

## 2021-08-15 NOTE — Op Note (Signed)
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  Post-Op Diagnosis:   Same  Procedure:   Left unicondylar knee arthroplasty.  Surgeon:   Maryagnes Amos, MD  Assistant:   Horris Latino, PA-C  Anesthesia:   Spinal  Findings:   As above.  Complications:   None  EBL:   5 cc  Fluids:   800 cc crystalloid  UOP:   None  TT:   65 minutes at 300 mmHg  Drains:   None  Closure:   Staples  Implants:   All-cemented Biomet Oxford system with a medium femoral component, an "A" sized tibial tray, and a 5 mm meniscal bearing insert.  Brief Clinical Note:   The patient is a 80 year old male with a several year history of progressive worsening medial-sided left knee pain. His symptoms have progressed despite medications, activity modification, injections, etc. His history and examination are consistent with a complex medial meniscus tear and underlying degenerative joint disease, all of which were confirmed by preoperative MRI scanning. The patient presents at this time for a left partial knee replacement.  Procedure:   The patient was brought into the operating room and a spinal placed by the anesthesiologist. The patient was lain in the supine position before being repositioned so that the non-surgical leg was placed in a flexed and abducted position in the yellow fin leg holder while the surgical extremity was placed over the Biomet leg holder. The left lower extremity was prepped with ChloraPrep solution before being draped sterilely. Preoperative antibiotics were administered. After performing a timeout to verify the appropriate surgical site, the limb was exsanguinated with an Esmarch and the tourniquet inflated to 300 mmHg.   A standard anterior approach to the knee was made through an approximately 3.5-4 inch incision. The incision was carried down through the subcutaneous tissues to  expose the superficial retinaculum. This was split the length the incision and the medial flap elevated sufficiently to expose the medial retinaculum. The medial retinaculum was incised along the medial border of the patella tendon and extended proximally along the medial border of the patella, leaving a 3-4 mm cuff of tissue. The soft tissues were elevated off the anteromedial aspect of the proximal tibia. The anterior portion of the meniscus was removed after performing a subtotal excision of the infrapatellar fat pad. The anterior cruciate ligament was inspected and found to be in excellent condition. Osteophytes were removed from the inferior pole of the patella as well as from the notch using a quarter-inch osteotome. There were moderate degenerative changes of both the femur and tibia on the medial side.   The medial femoral condyle was sized using the small and medium sizers. It was felt that the medium guide best optimized the contour of the femur. This was left in place and the external tibial guide positioned. The coupling device was used to connect the guide to the medial femoral condylar sizer to optimize appropriate orientation. Two guide pins were inserted into the cutting block before the coupling device and sizer were removed. The appropriate tibial cut was made using the oscillating and reciprocating saws. The piece was removed in its entirety and taken to the back table where it was sized and found to be optimally replicated by an "A" sized component. The 9 mm spacer was inserted to verify that sufficient bone had been removed.  Attention was directed to  femoral side. The intramedullary canal was accessed through a 4 mm drill hole. The intramedullary guide was positioned before the guide for the femoral condylar holes was positioned. The appropriate coupling device connected this guide to the intramedullary guide before both drill holes were created in the distal aspect of the medial femoral  condyle. The devices were removed and the posterior condylar cutting block inserted. The appropriate cut was made using the reciprocating saw and this piece removed. The #0 spigot was inserted and the initial bone milling performed. A trial femoral component was inserted and both the flexion and extension gaps measured. In flexion, the gap measured 9 mm whereas in extension, it measured 6 mm. Therefore, the #3 spigot was selected and the secondary bone milling performed. Repeat sizing demonstrated symmetric flexion and extension gaps. The bone was removed from the postero-medial and postero-lateral aspects of the femoral condyle, as well as from the beneath the collar of the spigot. Bone also was removed from the anterior portion of the femur so as to minimize any potential impingement with the meniscal bearing insert. The trial components removed and several drill holes placed into the distal femoral condyle to further augment cement fixation.  Attention was redirected to the tibial side. The "A" sized tibial tray was positioned and temporarily secured using the appropriate spiked nail. The keel was created using the bi-bladed reciprocating saw and hoe. The keeled "A" sized trial tibial tray was inserted to be sure that it seated properly. At this point, a total of 20 cc of Exparel diluted out to 60 cc with normal saline and 30 cc of 0.5% Sensorcaine was injected in and around the posterior and medial capsular tissues, as well as the peri-incisional tissues to help with postoperative pain control.  The bony surfaces were prepared for cementing by irrigating them thoroughly with bacitracin saline solution using the jet lavage system before packing them with a dry Ray-Tec sponge. Meanwhile, cement was being mixed on the back table. When the cement was ready, the tibial tray was cemented in first. The excess cement was removed using a Public house manager after impacting it into place. Next, the femoral component was  impacted into place. Again the excess cement was removed using a Public house manager. The 5 mm spacer was inserted and the knee brought into near full extension while the cement hardened. Once the cement hardened, the spacer was removed and the 5 mm meniscal bearing insert was trialed. This demonstrated excellent tracking while the knee was placed through a range of motion, and showed no evidence towards subluxation or dislocation. In addition, it did not fit too tightly. Therefore, the permanent 5 mm meniscal bearing insert was snapped into position after verifying that no cement had been retained posteriorly. Again the knee was placed through a range of motion with the findings as described above.  The wound was copiously irrigated with sterile saline solution via the jet lavage system before the retinacular layer was reapproximated using #0 Vicryl interrupted sutures. At this point, 1 g of transexemic acid in 10 cc of normal saline was injected intra-articularly. The subcutaneous tissues were closed in two layers using 2-0 Vicryl interrupted sutures before the skin was closed using staples. A sterile occlusive dressing was applied to the knee before the patient was awakened. The patient was transferred back to his/her hospital bed and returned to the recovery room in satisfactory condition after tolerating the procedure well. A Polar Care device was applied to the knee as well.

## 2021-08-15 NOTE — OR Nursing (Signed)
Dr. Joice Lofts here.  Discussed with patient and decided to remove foley and attempt to fill bladder and empty before DC home. No pain.  Ambulating with walker without difficulty.

## 2021-08-16 ENCOUNTER — Encounter: Payer: Self-pay | Admitting: Surgery

## 2021-12-13 ENCOUNTER — Other Ambulatory Visit
Admission: RE | Admit: 2021-12-13 | Discharge: 2021-12-13 | Disposition: A | Discharge status: Home / Self Care | Payer: Medicare Other | Source: Ambulatory Visit | Attending: Internal Medicine | Admitting: Internal Medicine

## 2021-12-13 DIAGNOSIS — Z0000 Encounter for general adult medical examination without abnormal findings: Secondary | ICD-10-CM | POA: Insufficient documentation

## 2021-12-13 DIAGNOSIS — R0602 Shortness of breath: Secondary | ICD-10-CM | POA: Diagnosis present

## 2021-12-13 LAB — D-DIMER, QUANTITATIVE: D-Dimer, Quant: 0.49 ug/mL-FEU (ref 0.00–0.50)

## 2021-12-14 ENCOUNTER — Other Ambulatory Visit: Payer: Self-pay | Admitting: Internal Medicine

## 2021-12-14 DIAGNOSIS — E1122 Type 2 diabetes mellitus with diabetic chronic kidney disease: Secondary | ICD-10-CM

## 2021-12-19 ENCOUNTER — Ambulatory Visit
Admission: RE | Admit: 2021-12-19 | Discharge: 2021-12-19 | Disposition: A | Discharge status: Home / Self Care | Payer: Medicare Other | Source: Ambulatory Visit | Attending: Internal Medicine | Admitting: Internal Medicine

## 2021-12-19 DIAGNOSIS — N1832 Chronic kidney disease, stage 3b: Secondary | ICD-10-CM

## 2022-10-31 IMAGING — US US RENAL
1 series · 14 of 25 positions shown · non-contrast
Comparison: None.

CLINICAL DATA: Chronic kidney disease in a patient with a history
of type 2 diabetes. History of prostatomegaly.

EXAM:
RENAL / URINARY TRACT ULTRASOUND COMPLETE

[Series 1: us renal · 14 of 65 slices shown]
[im 1/65]
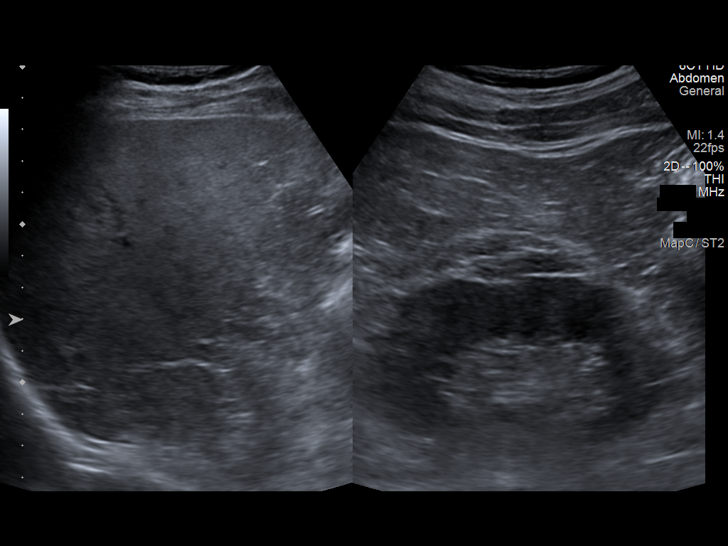
[im 6/65]
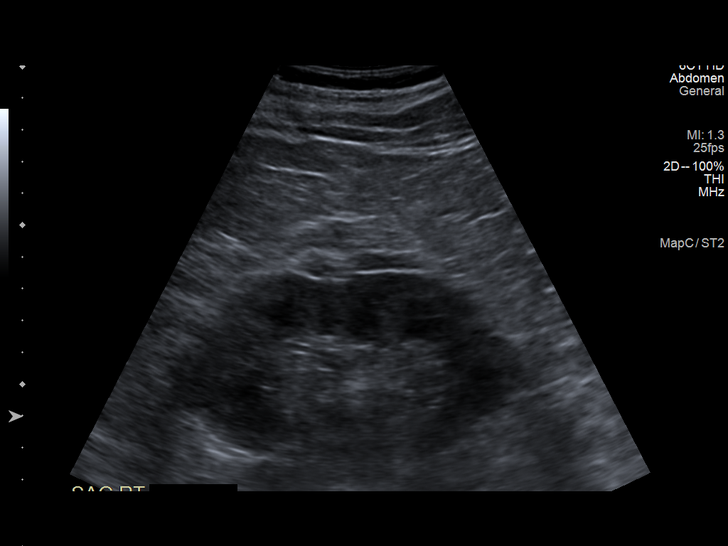
[im 11/65]
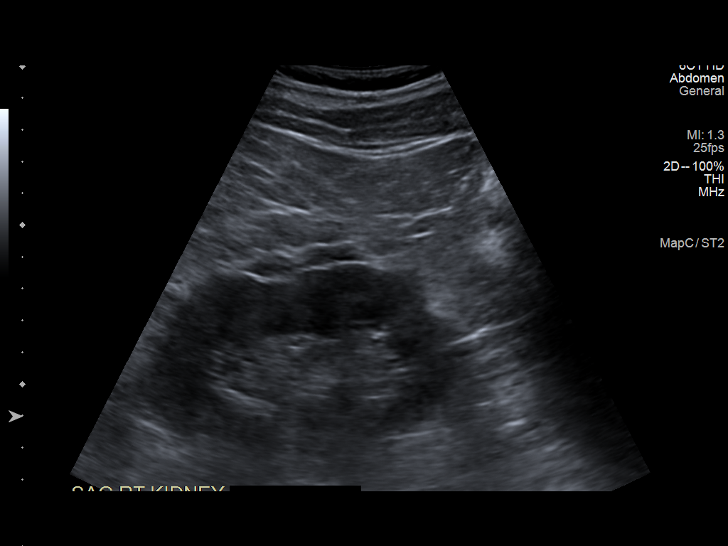
[im 17/65]
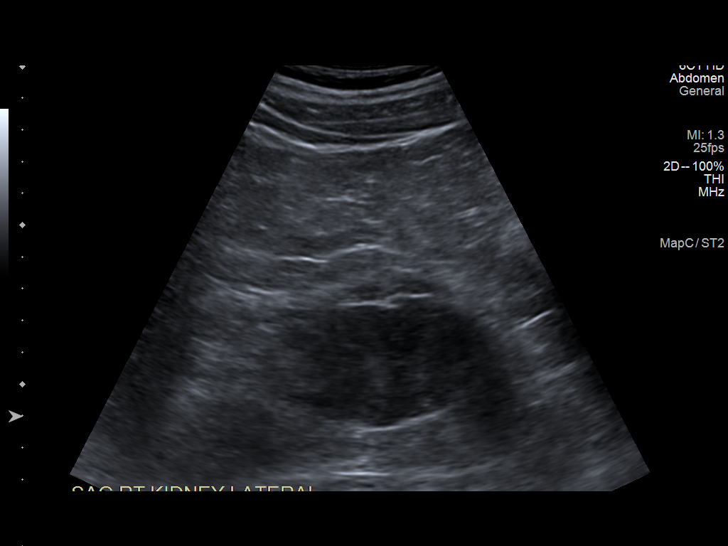
[im 22/65]
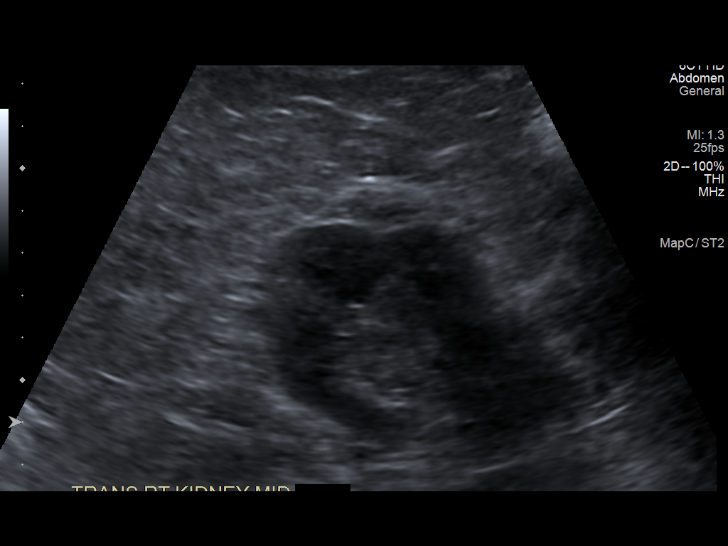
[im 25/65]
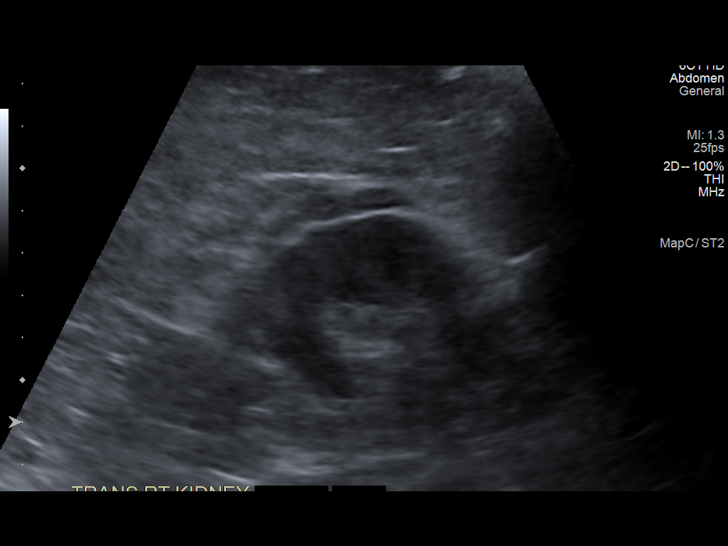
[im 30/65]
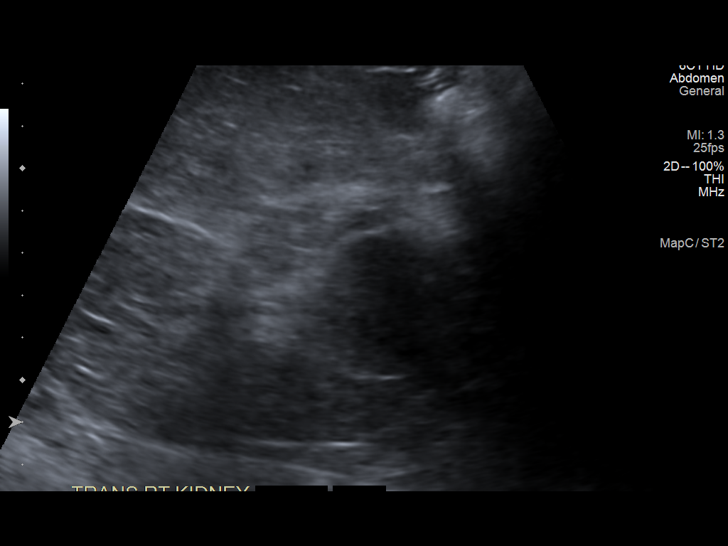
[im 35/65]
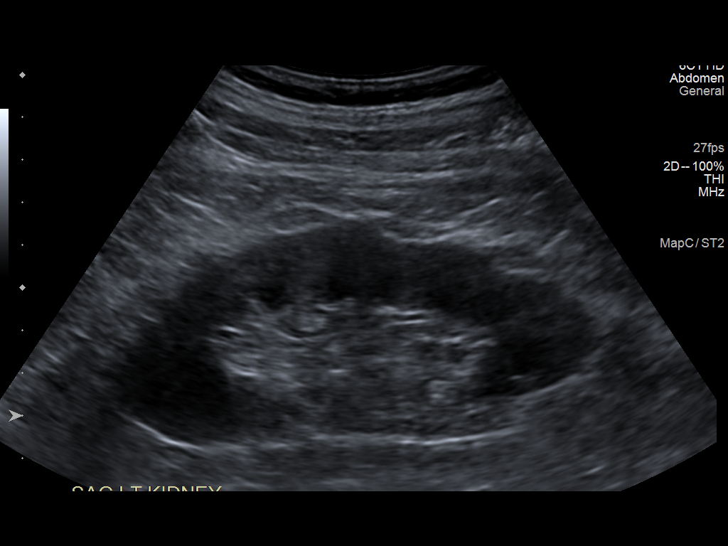
[im 41/65]
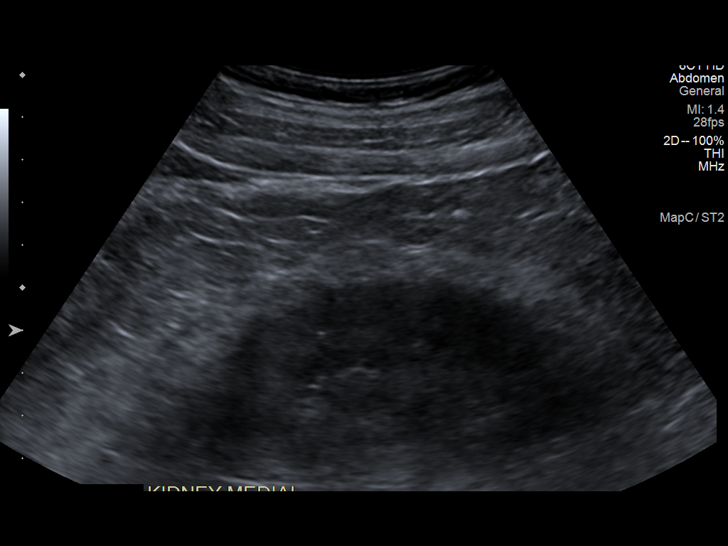
[im 43/65]
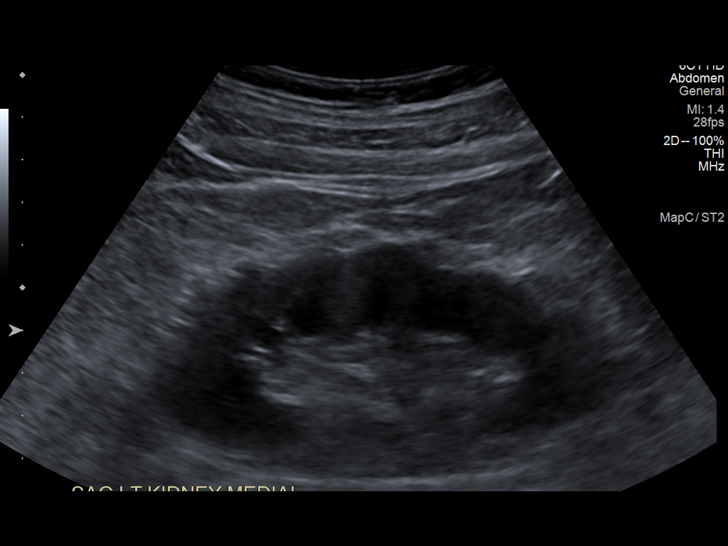
[im 49/65]
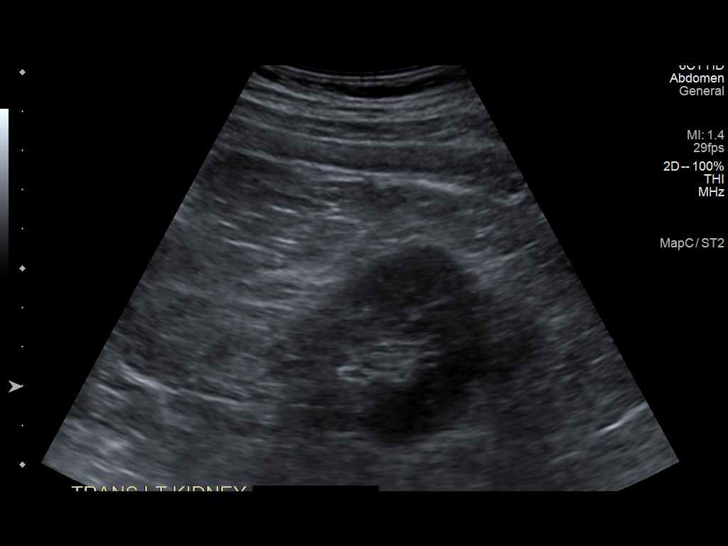
[im 54/65]
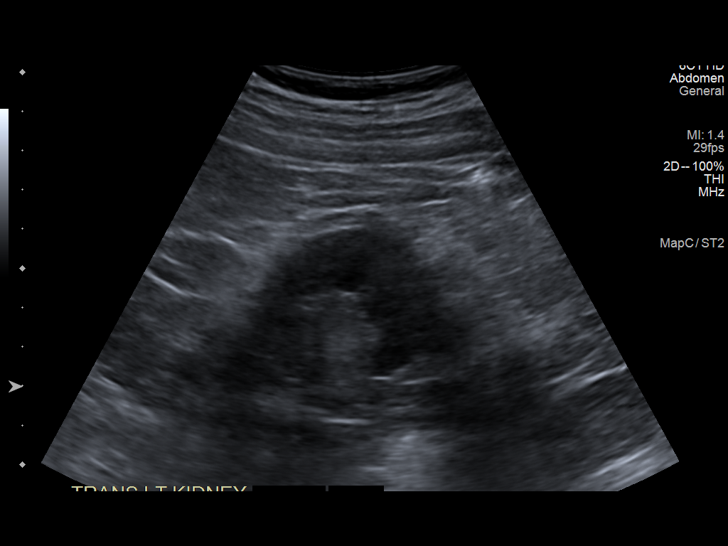
[im 59/65]
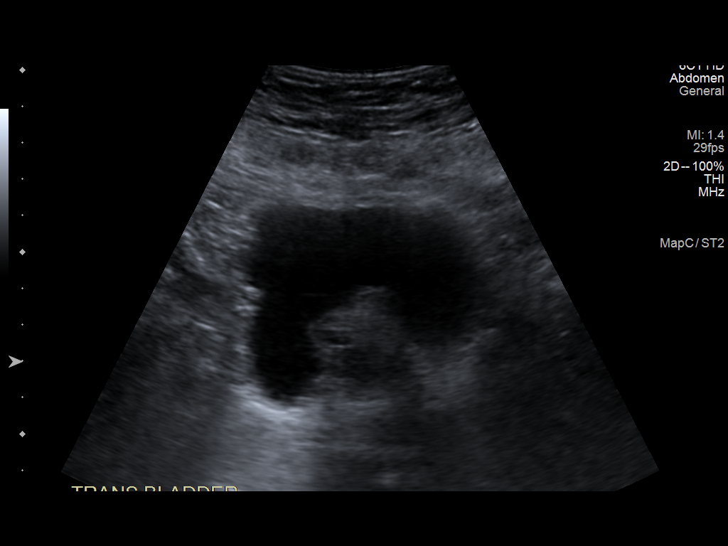
[im 65/65]
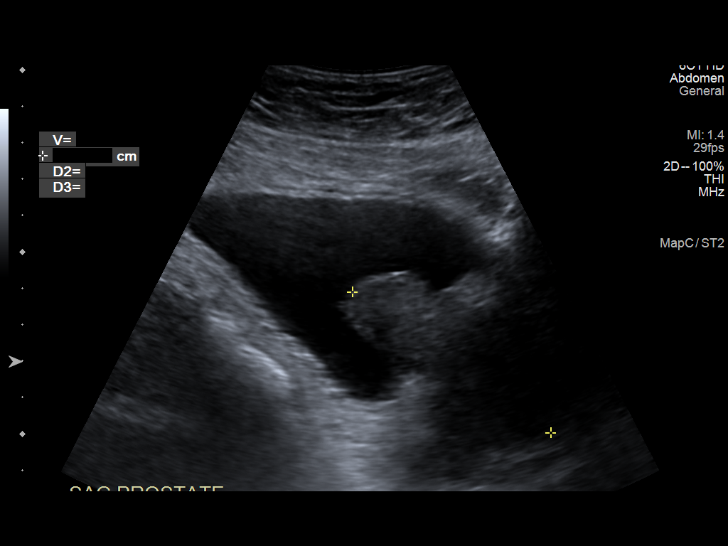

[14 of 25 positions shown; findings below may reference images not displayed]

FINDINGS: Right Kidney:

Renal measurements: 10.6 x 5.8 x 5.5 cm = volume: 179.0 mL.
Echogenicity within normal limits. No mass or hydronephrosis
visualized.

Left Kidney:

Renal measurements: 10.9 x 5.2 x 4.0 cm = volume: 118.9 mL.
Echogenicity within normal limits. No mass or hydronephrosis
visualized.

Bladder:

Appears normal for degree of bladder distention.

Other:

Prostate gland is enlarged measuring 6.7 x 6.0 x 4.9 cm for a volume
of 102.8 cm cubed.
IMPRESSION: No acute abnormality.  The kidneys appear normal.

Prostatomegaly.

## 2024-05-13 ENCOUNTER — Other Ambulatory Visit: Payer: Self-pay | Admitting: Surgery

## 2024-05-13 DIAGNOSIS — M1711 Unilateral primary osteoarthritis, right knee: Secondary | ICD-10-CM

## 2024-05-24 ENCOUNTER — Ambulatory Visit
Admission: RE | Admit: 2024-05-24 | Discharge: 2024-05-24 | Disposition: A | Discharge status: Home / Self Care | Source: Ambulatory Visit | Attending: Surgery | Admitting: Surgery

## 2024-05-24 DIAGNOSIS — M1711 Unilateral primary osteoarthritis, right knee: Secondary | ICD-10-CM | POA: Diagnosis present
